# Patient Record
Sex: Male | Born: 1985 | Race: White | Hispanic: No | Marital: Single | Smoking: Current some day smoker
Health system: Southern US, Community
[De-identification: ages and names within clinical notes are randomized; demographics above are authoritative.]

## PROBLEM LIST (undated history)

## (undated) DIAGNOSIS — R011 Cardiac murmur, unspecified: Secondary | ICD-10-CM

## (undated) HISTORY — PX: MEDIAL COLLATERAL LIGAMENT REPAIR, KNEE: SHX2019

## (undated) HISTORY — PX: TONSILLECTOMY: SUR1361

---
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
## 2005-02-25 ENCOUNTER — Emergency Department (HOSPITAL_COMMUNITY): Admission: EM | Admit: 2005-02-25 | Discharge: 2005-02-26 | Payer: Self-pay | Admitting: Emergency Medicine

## 2005-03-13 ENCOUNTER — Ambulatory Visit (HOSPITAL_COMMUNITY): Admission: RE | Admit: 2005-03-13 | Discharge: 2005-03-13 | Payer: Self-pay | Admitting: Family Medicine

## 2005-06-18 IMAGING — CR DG ANKLE COMPLETE 3+V*R*
2 series · 2 of 2 positions shown · non-contrast
Comparison: none

CLINICAL DATA: Ankle injury. 
 RIGHT ANKLE THREE VIEWS ([DATE] HOURS)

[view not recorded (1 of 2)]
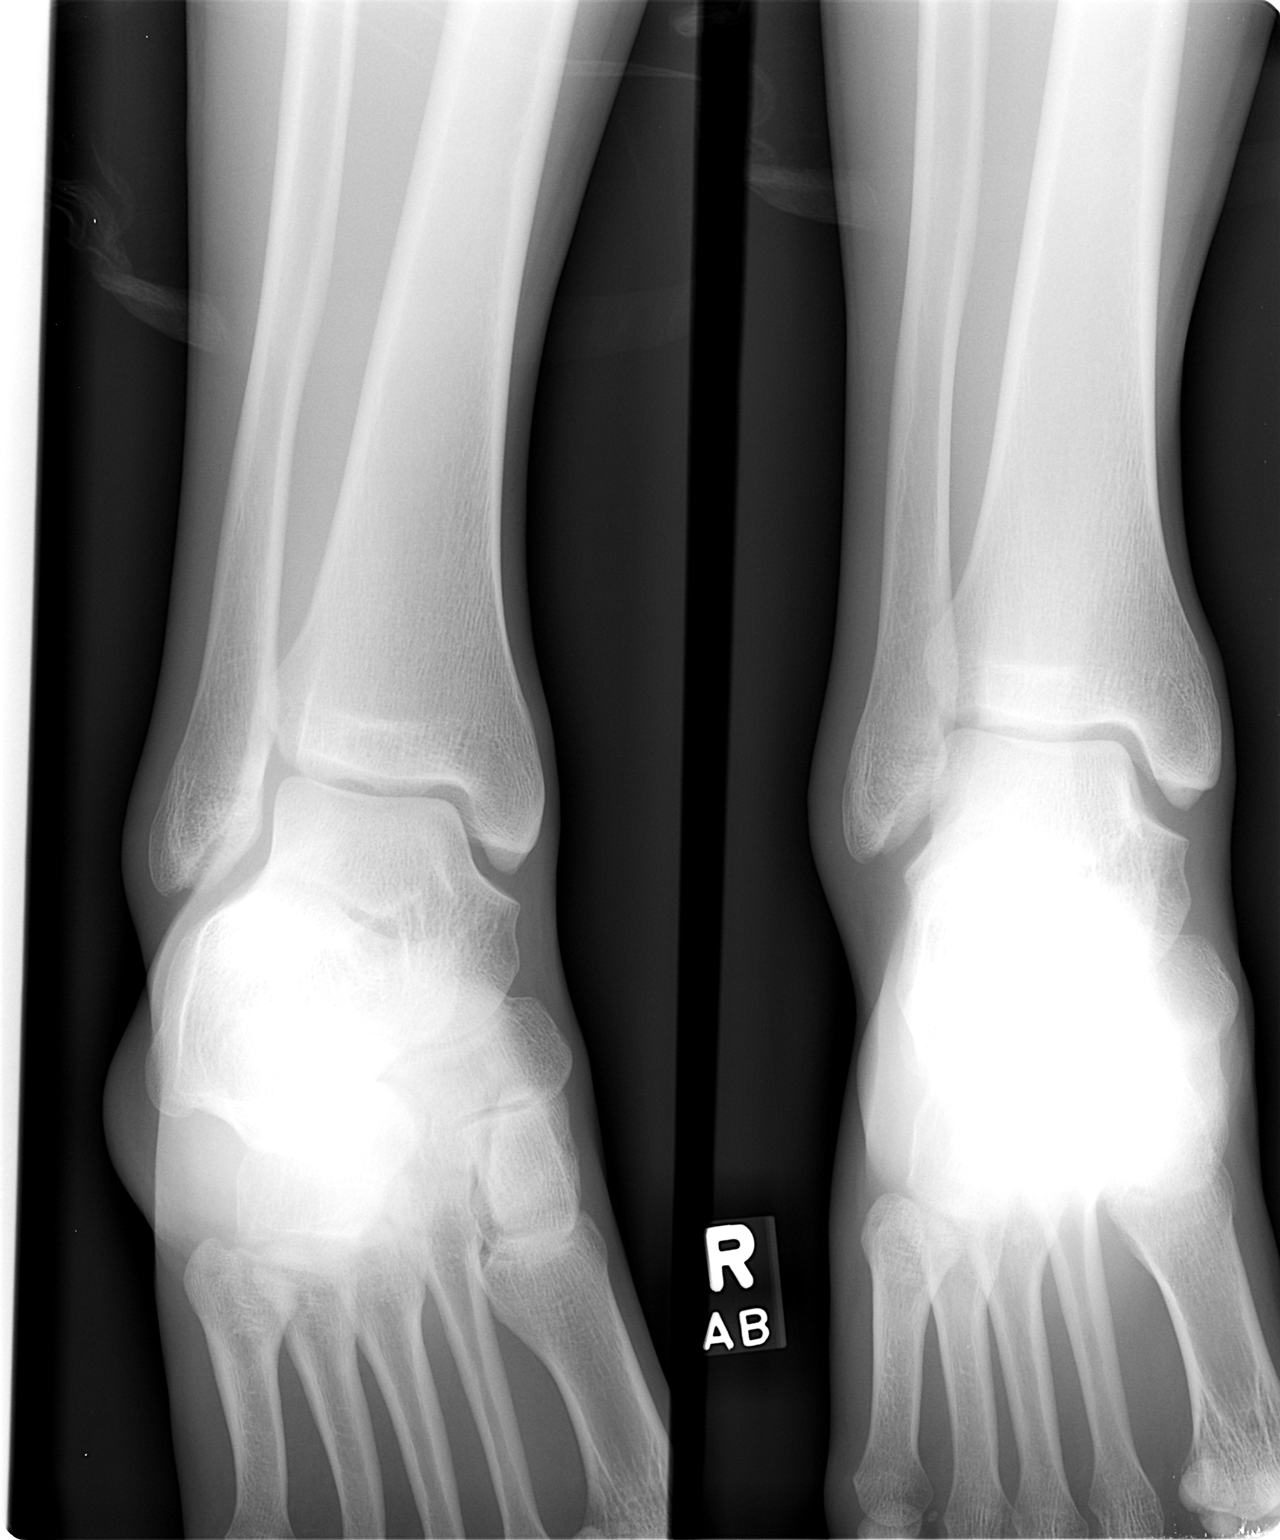

[view not recorded (2 of 2)]
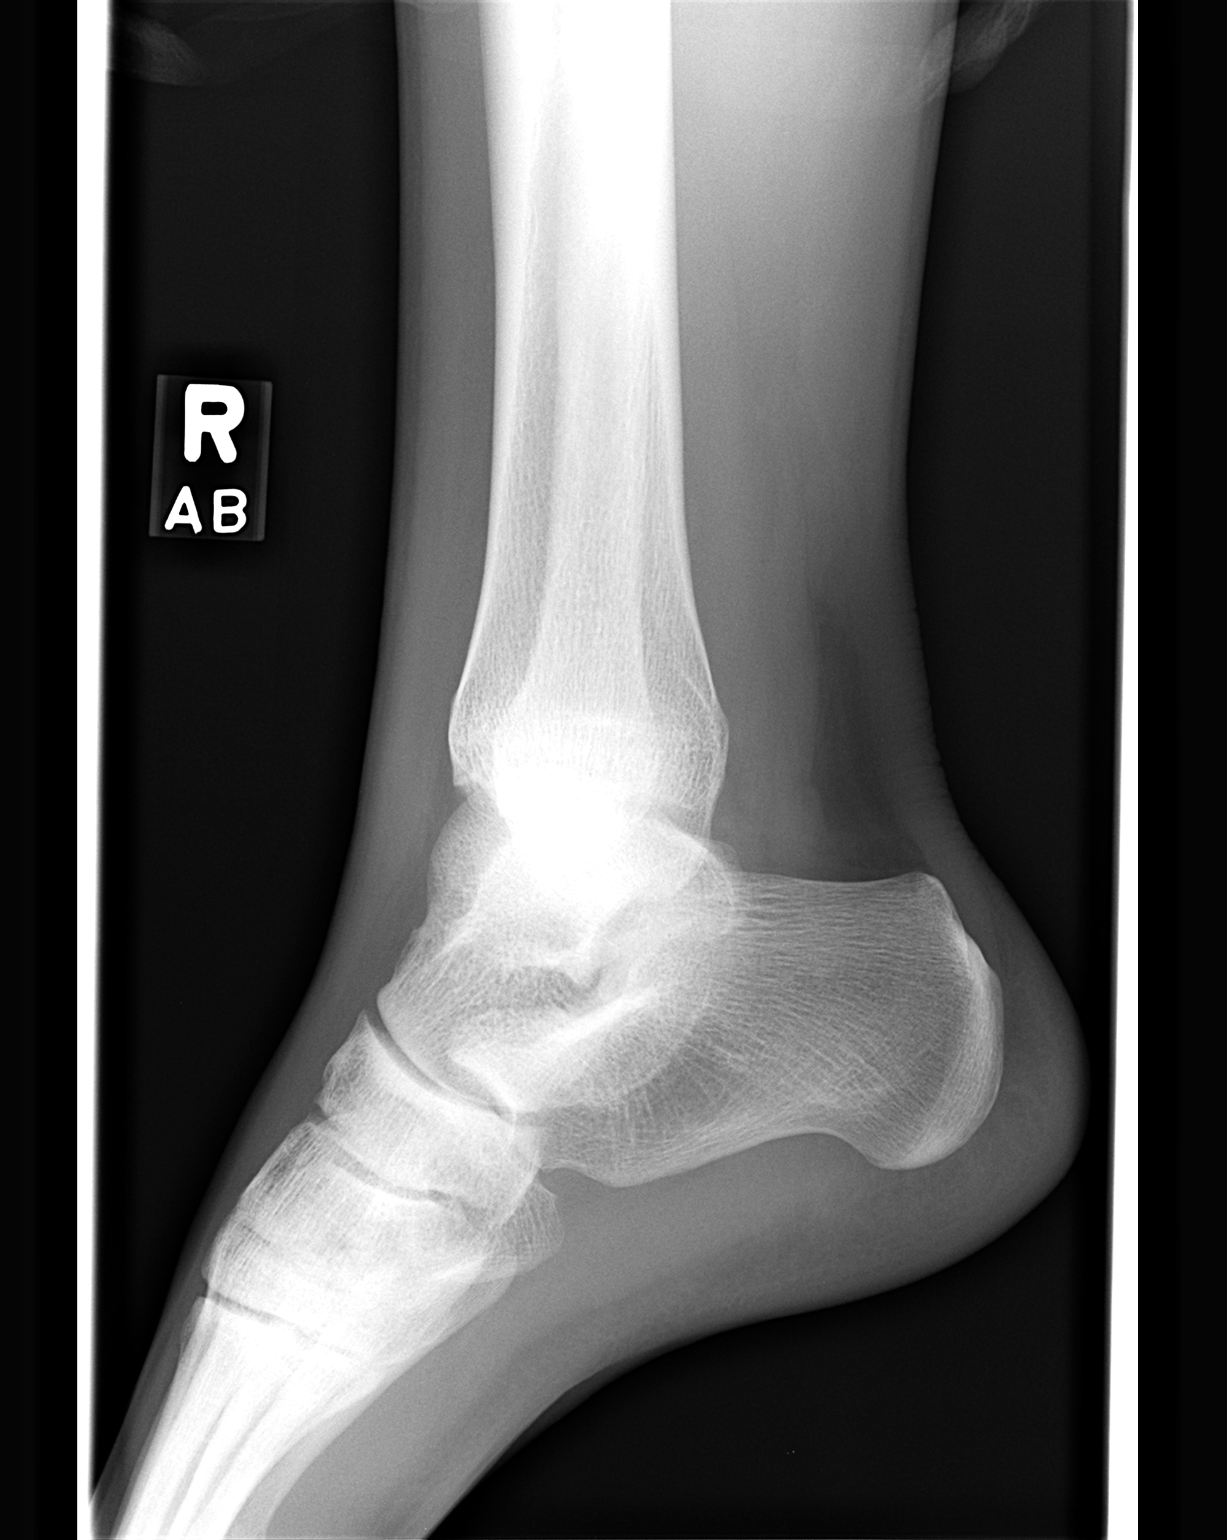

[2 of 2 positions shown; findings below may reference images not displayed]

FINDINGS: No fractures or dislocations are seen.  Soft tissue swelling is seen about the lateral malleolus. 
 IMPRESSION
 No acute fracture. 
 RIGHT FOOT (THREE VIEWS)

  There is no evidence of fracture or dislocation.  No other significant bone or soft tissue abnormalities are identified.  The joint spaces are within normal limits. 

 IMPRESSION
 Normal study.

## 2007-04-23 ENCOUNTER — Emergency Department (HOSPITAL_COMMUNITY): Admission: EM | Admit: 2007-04-23 | Discharge: 2007-04-23 | Payer: Self-pay | Admitting: Emergency Medicine

## 2007-12-14 ENCOUNTER — Emergency Department (HOSPITAL_COMMUNITY): Admission: EM | Admit: 2007-12-14 | Discharge: 2007-12-14 | Payer: Self-pay | Admitting: Emergency Medicine

## 2008-10-24 ENCOUNTER — Emergency Department (HOSPITAL_COMMUNITY): Admission: EM | Admit: 2008-10-24 | Discharge: 2008-10-24 | Payer: Self-pay | Admitting: Emergency Medicine

## 2009-03-24 IMAGING — CR DG CHEST 2V
2 series · 2 of 2 positions shown · non-contrast
Comparison: None

CLINICAL DATA: Chest pain, short of breath, smoking history

CHEST - 2 VIEW

[w chest pa]
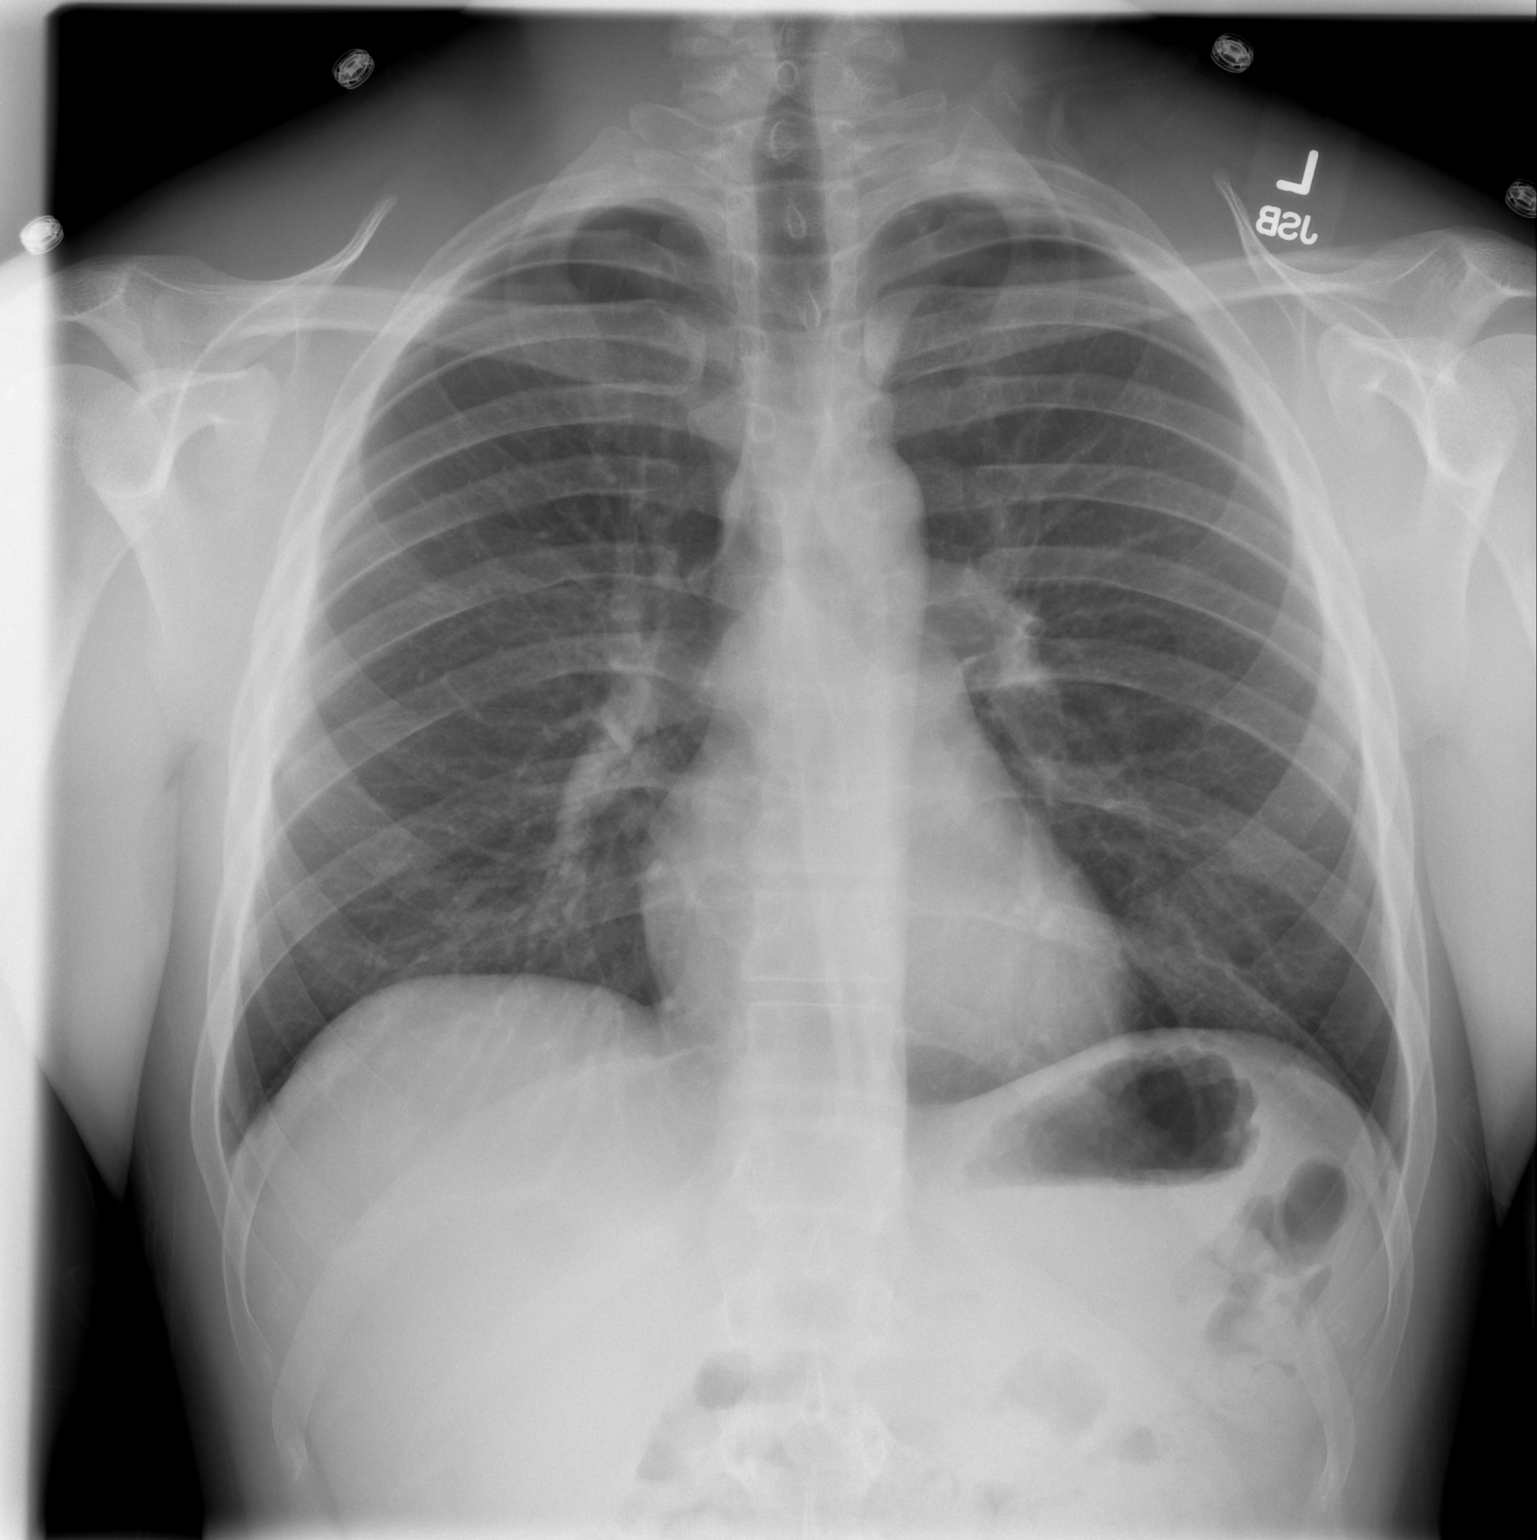

[w chest lat]
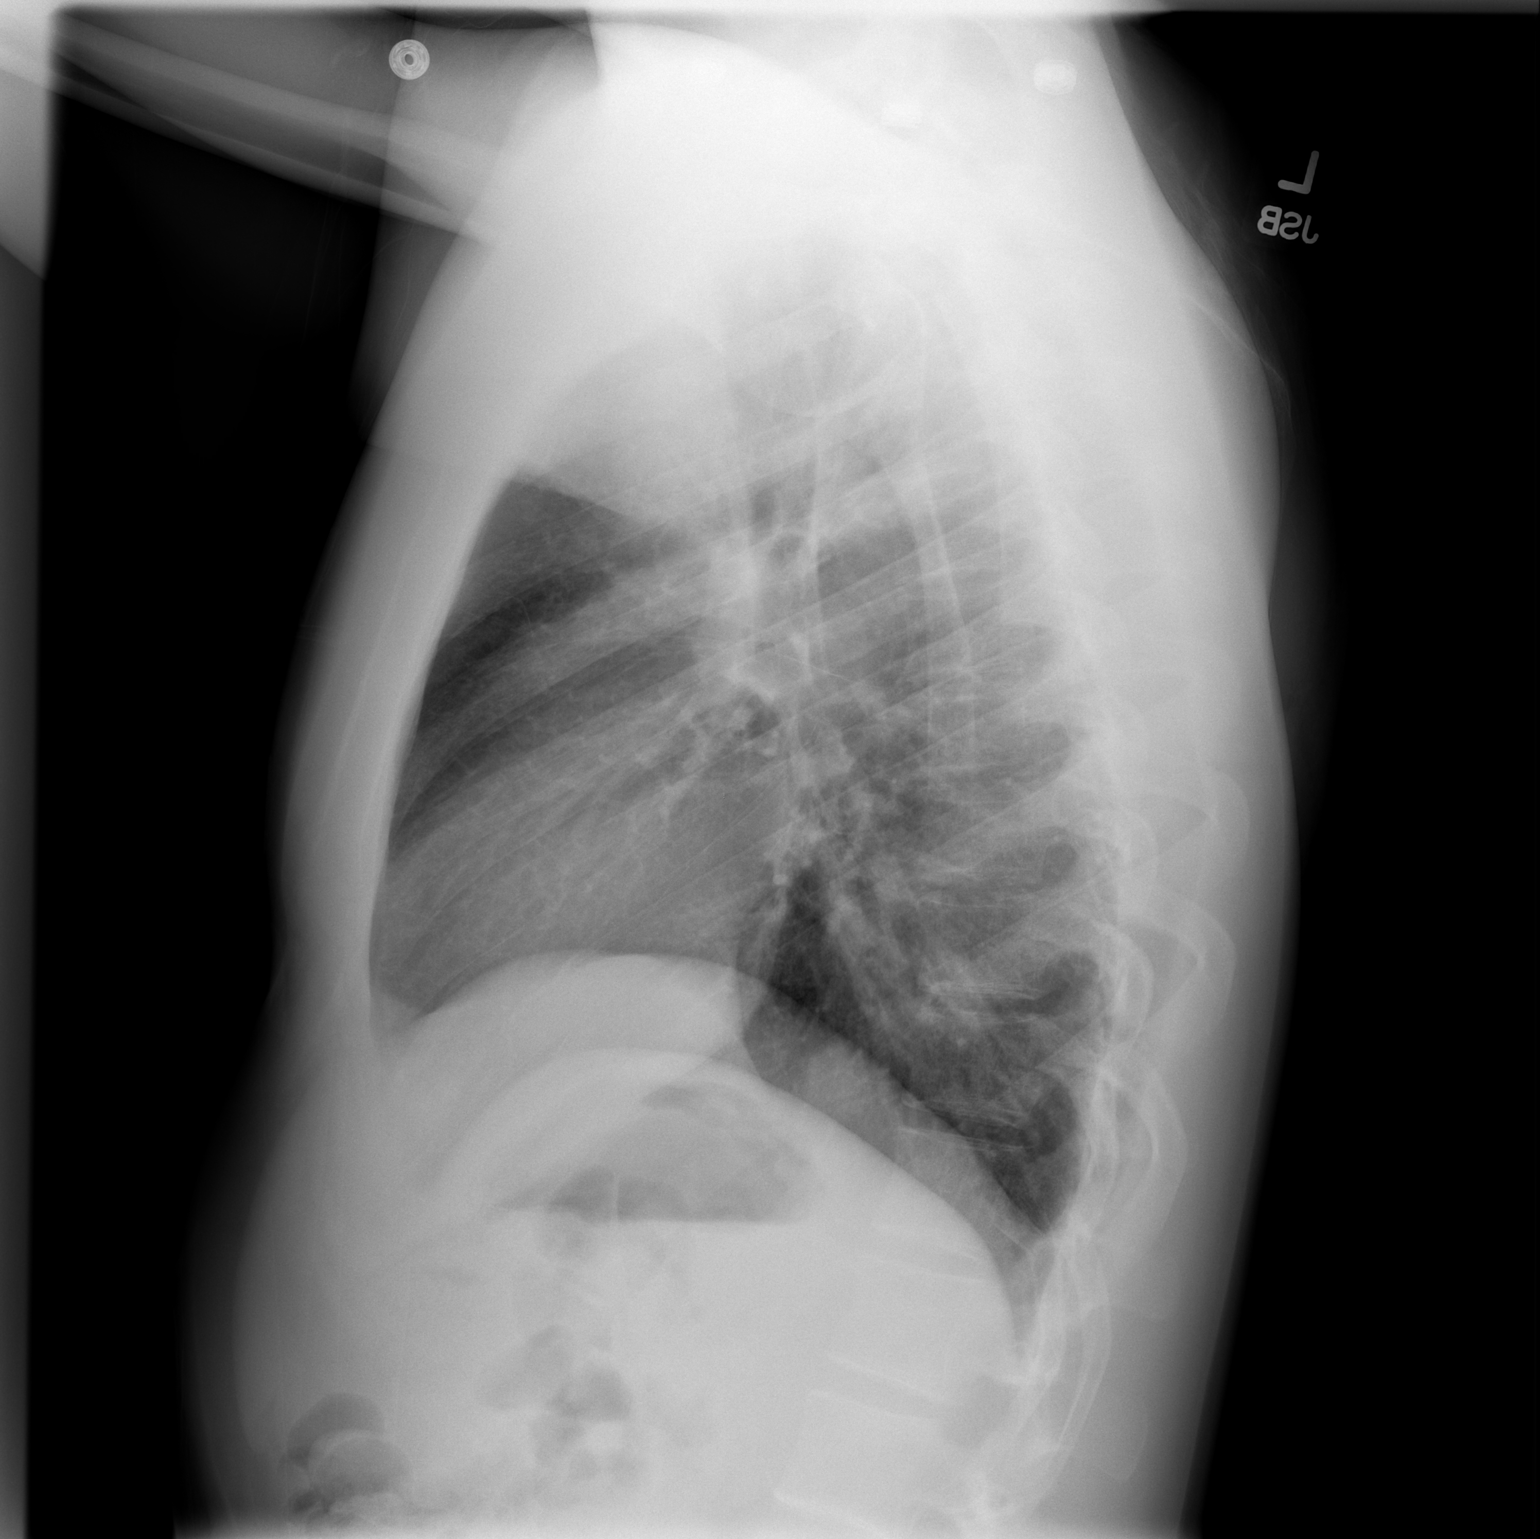

[2 of 2 positions shown; findings below may reference images not displayed]

FINDINGS: Two views of the chest show the lungs to be clear. Mild
peribronchial thickening is noted. The heart is within normal
limits in size.  No bony abnormality is seen.
IMPRESSION: .  No active lung disease.

## 2010-09-01 LAB — RAPID STREP SCREEN (MED CTR MEBANE ONLY): Streptococcus, Group A Screen (Direct): POSITIVE — AB

## 2011-09-23 ENCOUNTER — Encounter (HOSPITAL_COMMUNITY): Payer: Self-pay | Admitting: Family Medicine

## 2011-09-23 ENCOUNTER — Emergency Department (HOSPITAL_COMMUNITY)
Admission: EM | Admit: 2011-09-23 | Discharge: 2011-09-23 | Disposition: A | Discharge status: Home / Self Care | Payer: Worker's Compensation | Attending: Emergency Medicine | Admitting: Emergency Medicine

## 2011-09-23 DIAGNOSIS — W260XXA Contact with knife, initial encounter: Secondary | ICD-10-CM | POA: Insufficient documentation

## 2011-09-23 DIAGNOSIS — F17200 Nicotine dependence, unspecified, uncomplicated: Secondary | ICD-10-CM | POA: Insufficient documentation

## 2011-09-23 DIAGNOSIS — S61213A Laceration without foreign body of left middle finger without damage to nail, initial encounter: Secondary | ICD-10-CM

## 2011-09-23 DIAGNOSIS — S61209A Unspecified open wound of unspecified finger without damage to nail, initial encounter: Secondary | ICD-10-CM | POA: Insufficient documentation

## 2011-09-23 DIAGNOSIS — M79609 Pain in unspecified limb: Secondary | ICD-10-CM | POA: Insufficient documentation

## 2011-09-23 MED ORDER — TETANUS-DIPHTH-ACELL PERTUSSIS 5-2.5-18.5 LF-MCG/0.5 IM SUSP
0.5000 mL | Freq: Once | INTRAMUSCULAR | Status: AC
  Administered 2011-09-23: 0.5 mL via INTRAMUSCULAR
  Filled 2011-09-23: qty 0.5

## 2011-09-23 MED ORDER — NAPROXEN 500 MG PO TABS: 500.0000 mg | ORAL_TABLET | Freq: Two times a day (BID) | ORAL | Status: DC

## 2011-09-23 NOTE — Discharge Instructions (Signed)
The tissue glue will gradually come off over 7-10 days, do not peel off early, please avoid the use of her left hand for the next 2 days.

## 2011-09-23 NOTE — ED Provider Notes (Signed)
History     CSN: [id number]  Arrival date & time 09/23/11  8413   First MD Initiated Contact with Patient 09/23/11 0541      Chief Complaint  Patient presents with  . Extremity Laceration    (Consider location/radiation/quality/duration/timing/severity/associated sxs/prior treatment) HPI Comments: Acute onset of laceration to the dorsum of the left middle finger on the middle phalanx which occurred just prior to arrival. The pain is mild, persistent, worse with palpation and not associated with numbness or tingling of the finger.  The history is provided by the patient.    History reviewed. No pertinent past medical history.  Past Surgical History  Procedure Date  . Tonsillectomy     No family history on file.  History  Substance Use Topics  . Smoking status: Current Everyday Smoker -- 0.5 packs/day  . Smokeless tobacco: Not on file  . Alcohol Use: Yes     occasional      Review of Systems  Constitutional: Negative for fever.  Gastrointestinal: Negative for vomiting.  Skin: Positive for wound.       Laceration  Neurological: Negative for weakness and numbness.    Allergies  Ceclor  Home Medications   Current Outpatient Rx  Name Route Sig Dispense Refill  . NAPROXEN 500 MG PO TABS Oral Take 1 tablet (500 mg total) by mouth 2 (two) times daily with a meal. 30 tablet 0    BP 131/73  Pulse 67  Temp(Src) 97.6 F (36.4 C) (Oral)  Resp 20  Wt 172 lb 6 oz (78.189 kg)  SpO2 100%  Physical Exam  Constitutional: He appears well-developed and well-nourished. No distress.  HENT:  Head: Normocephalic.  Eyes: Conjunctivae are normal. No scleral icterus.  Cardiovascular: Normal rate and regular rhythm.   Pulmonary/Chest: Effort normal and breath sounds normal.  Musculoskeletal: Normal range of motion. He exhibits tenderness ( ttp over the laceration site). He exhibits no edema.  Neurological: He is alert. Coordination normal.       Sensation and motor intact    Skin: Skin is warm and dry. He is not diaphoretic.       Laceration located on the dorsum of the left middle finger The Laceration is linear shaped The depth is superficial The length is 1.5 cm    ED Course  Procedures (including critical care time)  Labs Reviewed - No data to display No results found.   1. Laceration of left middle finger w/o foreign body w/o damage to nail       MDM  Patient is well appearing, mild laceration which can be repaired with Dermabond. Irrigated with 1 L of normal saline and wound cleansed and explored with no signs of foreign body. See repair note  LACERATION REPAIR Performed by: Vida Roller Authorized by: Vida Roller Consent: Verbal consent obtained. Risks and benefits: risks, benefits and alternatives were discussed Consent given by: patient Patient identity confirmed: provided demographic data Prepped and Draped in normal sterile fashion Wound explored  Laceration Location: Dorsum of left middle finger  Laceration Length: 1.5 cm cm  No Foreign Bodies seen or palpated  Anesthesia: None   Irrigation method: syringe Amount of cleaning: standard  Skin closure: Dermabond    Patient tolerance: Patient tolerated the procedure well with no immediate complications.    Patient informed of indications for return, agrees with plan, Naprosyn for home     Vida Roller, MD 09/23/11 0600

## 2011-09-23 NOTE — ED Notes (Signed)
Patient states he cut his finger on a box cutter.

## 2012-04-07 ENCOUNTER — Emergency Department (HOSPITAL_COMMUNITY)
Admission: EM | Admit: 2012-04-07 | Discharge: 2012-04-07 | Disposition: A | Discharge status: Home / Self Care | Payer: 59 | Attending: Emergency Medicine | Admitting: Emergency Medicine

## 2012-04-07 ENCOUNTER — Encounter (HOSPITAL_COMMUNITY): Payer: Self-pay | Admitting: *Deleted

## 2012-04-07 DIAGNOSIS — Y939 Activity, unspecified: Secondary | ICD-10-CM | POA: Insufficient documentation

## 2012-04-07 DIAGNOSIS — S90569A Insect bite (nonvenomous), unspecified ankle, initial encounter: Secondary | ICD-10-CM | POA: Insufficient documentation

## 2012-04-07 DIAGNOSIS — Z8679 Personal history of other diseases of the circulatory system: Secondary | ICD-10-CM | POA: Insufficient documentation

## 2012-04-07 DIAGNOSIS — T63481A Toxic effect of venom of other arthropod, accidental (unintentional), initial encounter: Secondary | ICD-10-CM

## 2012-04-07 DIAGNOSIS — Y929 Unspecified place or not applicable: Secondary | ICD-10-CM | POA: Insufficient documentation

## 2012-04-07 DIAGNOSIS — W57XXXA Bitten or stung by nonvenomous insect and other nonvenomous arthropods, initial encounter: Secondary | ICD-10-CM

## 2012-04-07 DIAGNOSIS — F17200 Nicotine dependence, unspecified, uncomplicated: Secondary | ICD-10-CM | POA: Insufficient documentation

## 2012-04-07 HISTORY — DX: Cardiac murmur, unspecified: R01.1

## 2012-04-07 MED ORDER — OXYCODONE-ACETAMINOPHEN 5-325 MG PO TABS
1.0000 | ORAL_TABLET | Freq: Once | ORAL | Status: AC
  Administered 2012-04-07: 1 via ORAL
  Filled 2012-04-07: qty 1

## 2012-04-07 MED ORDER — FAMOTIDINE 20 MG PO TABS: 20.0000 mg | ORAL_TABLET | Freq: Two times a day (BID) | ORAL | Status: DC

## 2012-04-07 MED ORDER — HYDROCODONE-ACETAMINOPHEN 5-325 MG PO TABS
1.0000 | ORAL_TABLET | Freq: Once | ORAL | Status: DC
  Filled 2012-04-07: qty 1

## 2012-04-07 MED ORDER — OXYCODONE-ACETAMINOPHEN 5-325 MG PO TABS: 1.0000 | ORAL_TABLET | ORAL | Status: DC | PRN

## 2012-04-07 MED ORDER — DIPHENHYDRAMINE HCL 25 MG PO TABS: 25.0000 mg | ORAL_TABLET | Freq: Four times a day (QID) | ORAL | Status: DC

## 2012-04-07 NOTE — ED Notes (Signed)
Patient given discharge instructions, information, prescriptions, and diet order. Patient states that they adequately understand discharge information given and to return to ED if symptoms return or worsen.     

## 2012-04-07 NOTE — ED Provider Notes (Signed)
History     CSN: [id number]  Arrival date & time 04/07/12  2057   First MD Initiated Contact with Patient 04/07/12 2241      Chief Complaint  Patient presents with  . Insect Bite    (Consider location/radiation/quality/duration/timing/severity/associated sxs/prior treatment) HPI Comments: Patient reports he was working outside today when he felt something stinging and burning on his left calf under his pants leg.  States he hit the area, trying to kill whatever insect was biting him.  Pt never saw the type of insect that bit him.  He has since developed pain and swelling that extends distal to the bite mark into his ankle and foot.  Has throbbing pain in his foot and ankle and occasional itching his foot.  Pain is worse with attempting to bear weight.  Denies fevers, chills, myalgias, abdominal pain, N/V.    The history is provided by the patient.    Past Medical History  Diagnosis Date  . Heart murmur     Past Surgical History  Procedure Date  . Tonsillectomy     History reviewed. No pertinent family history.  History  Substance Use Topics  . Smoking status: Current Every Day Smoker -- 0.5 packs/day  . Smokeless tobacco: Not on file  . Alcohol Use: Yes     Comment: occasional      Review of Systems  Constitutional: Negative for fever and chills.  Respiratory: Negative for shortness of breath.   Cardiovascular: Negative for chest pain.  Gastrointestinal: Negative for nausea, vomiting and abdominal pain.  Musculoskeletal: Negative for myalgias.  Skin: Positive for color change and wound.  Neurological: Negative for weakness and numbness.    Allergies  Ceclor and Oxycodone  Home Medications  No current outpatient prescriptions on file.  BP 142/84  Pulse 95  Temp 97.6 F (36.4 C) (Oral)  Resp 16  Ht 5\' 11"  (1.803 m)  Wt 165 lb (74.844 kg)  BMI 23.01 kg/m2  SpO2 98%  Physical Exam  Nursing note and vitals reviewed. Constitutional: He appears  well-developed and well-nourished. No distress.  HENT:  Head: Normocephalic and atraumatic.  Neck: Neck supple.  Pulmonary/Chest: Effort normal.  Neurological: He is alert.  Skin: He is not diaphoretic.          Distal pulses intact, sensation intact.      ED Course  Procedures (including critical care time)  Labs Reviewed - No data to display No results found.   1. Insect bite of left leg   2. Local reaction to insect sting     MDM  Pt with insect bite to left calf, pt did not see the insect though he does have bite mark in the affected area.  There is a single lesion.  No possibility of snake bite.   No systemic symptoms, no allergic reaction.  No necrosis of the area.  Pt is having localized reaction with swelling, redness, and pain.  Pain is worse in the area where he is most swollen.  Mild itching of left foot.  Discussed diagnosis, treatment plan, and follow up with patient.  Advised close monitoring of the area and return for worsening symptoms.  Pt given return precautions.  Pt verbalizes understanding and agrees with plan.           [place], [place] 04/08/12 [phone]

## 2012-04-07 NOTE — ED Notes (Signed)
Patient alert and oriented- ambulatory in triage. Patient sts he was working in his yard earlier when he felt something bite him. Red induration noted to left lower leg. Slight redness following up leg, ankle is very swollen. Patient sts walking has become more difficult throughout the day. No throat swelling or respiratory difficulty.

## 2012-04-12 NOTE — ED Provider Notes (Signed)
Medical screening examination/treatment/procedure(s) were performed by non-physician practitioner and as supervising physician I was immediately available for consultation/collaboration.   Lyanne Co, MD 04/12/12 [phone]

## 2013-06-14 ENCOUNTER — Emergency Department (HOSPITAL_COMMUNITY)
Admission: EM | Admit: 2013-06-14 | Discharge: 2013-06-14 | Disposition: A | Discharge status: Home / Self Care | Payer: 59 | Attending: Emergency Medicine | Admitting: Emergency Medicine

## 2013-06-14 ENCOUNTER — Encounter (HOSPITAL_COMMUNITY): Payer: Self-pay | Admitting: Emergency Medicine

## 2013-06-14 DIAGNOSIS — R011 Cardiac murmur, unspecified: Secondary | ICD-10-CM | POA: Insufficient documentation

## 2013-06-14 DIAGNOSIS — L03211 Cellulitis of face: Principal | ICD-10-CM | POA: Insufficient documentation

## 2013-06-14 DIAGNOSIS — L0201 Cutaneous abscess of face: Secondary | ICD-10-CM | POA: Insufficient documentation

## 2013-06-14 DIAGNOSIS — F17200 Nicotine dependence, unspecified, uncomplicated: Secondary | ICD-10-CM | POA: Insufficient documentation

## 2013-06-14 MED ORDER — OXYCODONE-ACETAMINOPHEN 5-325 MG PO TABS
1.0000 | ORAL_TABLET | Freq: Once | ORAL | Status: AC
  Administered 2013-06-14: 1 via ORAL
  Filled 2013-06-14: qty 1

## 2013-06-14 MED ORDER — OXYCODONE-ACETAMINOPHEN 5-325 MG PO TABS: 1.0000 | ORAL_TABLET | ORAL | Status: AC | PRN

## 2013-06-14 MED ORDER — SULFAMETHOXAZOLE-TMP DS 800-160 MG PO TABS
1.0000 | ORAL_TABLET | Freq: Once | ORAL | Status: AC
  Administered 2013-06-14: 1 via ORAL
  Filled 2013-06-14: qty 1

## 2013-06-14 MED ORDER — SULFAMETHOXAZOLE-TRIMETHOPRIM 800-160 MG PO TABS: 1.0000 | ORAL_TABLET | Freq: Two times a day (BID) | ORAL | Status: AC

## 2013-06-14 NOTE — ED Notes (Signed)
Pt states that he has a "knot" above right eye x 2 weeks; pt states that the "knot" is progressively getting worse; pt states that there is pressure to rt eye and states "It feels like something is going to pop out of my head"' pt denies head injury; family states that he has had confusion and disorientation off and on for the last 2 weeks and can be unsteady on feet at times; pt states that he has taken numerous medications for sinus pain and headache with little to no relief

## 2013-06-14 NOTE — Discharge Instructions (Signed)
Heat Therapy Heat therapy can help ease achy, tense, stiff, and tight muscles and joints. Heat should not be used on new injuries. Wait at least 48 hours after the injury before using heat therapy. Heat also should not be used for discomfort or pain that occurs right after doing an activity. If you still have pain or stiffness 3 hours after finishing the activity, then heat therapy may be used. PRECAUTIONS  High heat or prolonged exposure to heat can cause burns. Be careful when using heat therapy to avoid burning your skin. If you have any of the following conditions, do not use heat until you have discussed heat therapy with your caregiver:  Poor circulation.  Healing wounds or scarred skin in the area being treated.  Diabetes, heart disease, or high blood pressure.  Numbness of the area being treated.  Unusual swelling of the area being treated.  Active infections.  Blood clots.  Cancer.  Inability to communicate your response to pain. This can include young children and people with dementia. HOME CARE INSTRUCTIONS Moist heat pack  Soak a clean towel in warm water, and squeeze out the extra water. The water temperature should be comfortable to the skin.  Put the warm, wet towel in a plastic bag.  Place a thin, dry towel between your skin and the bag.  Put the heat pack on the area for 5 minutes, and check your skin. Your skin may be pink, but it should not be red.  Leave the heat pack on the area for a total of 15 to 30 minutes.  Repeat this every 2 to 4 hours while awake. Do not use heat while you are sleeping. Warm water bath  Fill a tub with warm water. The water temperature should be comfortable to the skin.  Place the affected body part in the tub.  Soak the area for 20 to 40 minutes.  Repeat as needed. Hot water bottle  Fill the water bottle half full with hot water.  Press out the extra air. Close the cap tightly.  Place a dry towel between your skin and  the bottle.  Put the bottle on the area for 5 minutes, and check your skin. Your skin may be pink, but it should not be red.  Leave the bottle on the area for a total of 15 to 30 minutes.  Repeat this every 2 to 4 hours while awake. Electric heating pad  Place a dry towel between your skin and the heating pad.  Set the heating pad on low heat.  Put the heating pad on the area for 10 minutes, and check your skin. Your skin may be pink, but it should not be red.  Leave the heating pad on the area for a total of 20 to 40 minutes.  Repeat this every 2 to 4 hours while awake.  Do not lie on the heating pad.  Do not fall asleep while using the heating pad.  Do not use the heating pad near water. Contact with water can result in an electrical shock. SEEK MEDICAL CARE IF:  You have blisters, redness, swelling, or numbness.  You have any new problems.  Your problems are getting worse.  You have any questions or concerns. If you develop any problems, stop using heat therapy until you see your caregiver. MAKE SURE YOU:  Understand these instructions.  Will watch your condition.  Will get help right away if you are not doing well or get worse. Document Released: 08/03/2011  Document Reviewed: 08/03/2011 [facility]Exit[facility] Patient Information 2014 [place], [place]. Abscess An abscess is an infected area that contains a collection of pus and debris.It can occur in almost any part of the body. An abscess is also known as a furuncle or boil. CAUSES  An abscess occurs when tissue gets infected. This can occur from blockage of oil or sweat glands, infection of hair follicles, or a minor injury to the skin. As the body tries to fight the infection, pus collects in the area and creates pressure under the skin. This pressure causes pain. People with weakened immune systems have difficulty fighting infections and get certain abscesses more often.  SYMPTOMS Usually an abscess develops on the skin and  becomes a painful mass that is red, warm, and tender. If the abscess forms under the skin, you may feel a moveable soft area under the skin. Some abscesses break open (rupture) on their own, but most will continue to get worse without care. The infection can spread deeper into the body and eventually into the bloodstream, causing you to feel ill.  DIAGNOSIS  Your caregiver will take your medical history and perform a physical exam. A sample of fluid may also be taken from the abscess to determine what is causing your infection. TREATMENT  Your caregiver may prescribe antibiotic medicines to fight the infection. However, taking antibiotics alone usually does not cure an abscess. Your caregiver may need to make a small cut (incision) in the abscess to drain the pus. In some cases, gauze is packed into the abscess to reduce pain and to continue draining the area. HOME CARE INSTRUCTIONS   Only take over-the-counter or prescription medicines for pain, discomfort, or fever as directed by your caregiver.  If you were prescribed antibiotics, take them as directed. Finish them even if you start to feel better.  If gauze is used, follow your caregiver's directions for changing the gauze.  To avoid spreading the infection:  Keep your draining abscess covered with a bandage.  Wash your hands well.  Do not share personal care items, towels, or whirlpools with others.  Avoid skin contact with others.  Keep your skin and clothes clean around the abscess.  Keep all follow-up appointments as directed by your caregiver. SEEK MEDICAL CARE IF:   You have increased pain, swelling, redness, fluid drainage, or bleeding.  You have muscle aches, chills, or a general ill feeling.  You have a fever. MAKE SURE YOU:   Understand these instructions.  Will watch your condition.  Will get help right away if you are not doing well or get worse. Document Released: 02/18/2005 Document Revised: 11/10/2011  Document Reviewed: 07/24/2011 [facility]Exit[facility] Patient Information 2014 [place]ExitCare, [place]LLC.

## 2013-06-14 NOTE — ED Provider Notes (Signed)
CSN: [id number]     Arrival date & time 06/14/13  2018 History   First MD Initiated Contact with Patient 06/14/13 2050     Chief Complaint  Patient presents with  . Headache   (Consider location/radiation/quality/duration/timing/severity/associated sxs/prior Treatment) Patient is a 28 y.o. male presenting with eye pain. The history is provided by the patient and the spouse. No language interpreter was used.  Eye Pain Associated symptoms include headaches. Pertinent negatives include no congestion, fever, nausea or vomiting. Associated symptoms comments: He has moderate to severe pain over his right eye for the past 2 weeks. There is intermittent swelling and redness. No lesion or drainage. He has not had any fever, sinus pressure or nasal congestion. No redness, pain or drainage associated with the eye. Marland Kitchen.    Past Medical History  Diagnosis Date  . Heart murmur    Past Surgical History  Procedure Laterality Date  . Tonsillectomy    . Medial collateral ligament repair, knee     No family history on file. History  Substance Use Topics  . Smoking status: Current Some Day Smoker -- 0.50 packs/day  . Smokeless tobacco: Not on file  . Alcohol Use: Yes     Comment: occasional    Review of Systems  Constitutional: Negative for fever.  HENT: Negative for congestion and sinus pressure.   Eyes: Negative for discharge and visual disturbance.       See HPI.  Gastrointestinal: Negative for nausea and vomiting.  Skin: Positive for color change.  Neurological: Positive for headaches.    Allergies  Ceclor and Vicodin  Home Medications   Current Outpatient Rx  Name  Route  Sig  Dispense  Refill  . diphenhydrAMINE (BENADRYL) 25 MG tablet   Oral   Take 25 mg by mouth every 6 (six) hours as needed for itching or sleep.          BP 142/83  Pulse 74  Temp(Src) 97.7 F (36.5 C) (Oral)  Resp 20  Ht 5\' 10"  (1.778 m)  Wt 165 lb (74.844 kg)  BMI 23.68 kg/m2  SpO2 100% Physical Exam   Constitutional: He is oriented to person, place, and time. He appears well-developed and well-nourished. No distress.  HENT:  Right eye brow with mild redness medially. No visualized swelling or palpable induration. Significantly tender.   Cardiovascular: Normal rate.   No murmur heard. Pulmonary/Chest: Effort normal.  Neurological: He is alert and oriented to person, place, and time.  Psychiatric: He has a normal mood and affect.    ED Course  Procedures (including critical care time) Labs Review Labs Reviewed - No data to display Imaging Review No results found.  EKG Interpretation   None       MDM  No diagnosis found. 1. Early abscess  No pain with eye movement, non-focal neurologic exam. Pain is localized with subjective history intermittent swelling and significant pain. Will treat with abx - ?abscess - and encourage PCP follow up .    Arnoldo HookerShari A Aarin Bluett, PA-C 06/14/13 2212

## 2013-06-15 NOTE — ED Provider Notes (Signed)
Medical screening examination/treatment/procedure(s) were performed by non-physician practitioner and as supervising physician I was immediately available for consultation/collaboration.  EKG Interpretation   None        Liala Codispoti R. Galit Urich, MD 06/15/13 0013 

## 2013-10-18 ENCOUNTER — Encounter (HOSPITAL_BASED_OUTPATIENT_CLINIC_OR_DEPARTMENT_OTHER): Payer: Self-pay | Admitting: Emergency Medicine

## 2013-10-18 ENCOUNTER — Emergency Department (HOSPITAL_BASED_OUTPATIENT_CLINIC_OR_DEPARTMENT_OTHER)
Admission: EM | Admit: 2013-10-18 | Discharge: 2013-10-18 | Disposition: A | Discharge status: Home / Self Care | Payer: 59 | Attending: Emergency Medicine | Admitting: Emergency Medicine

## 2013-10-18 DIAGNOSIS — F17200 Nicotine dependence, unspecified, uncomplicated: Secondary | ICD-10-CM | POA: Insufficient documentation

## 2013-10-18 DIAGNOSIS — R011 Cardiac murmur, unspecified: Secondary | ICD-10-CM | POA: Insufficient documentation

## 2013-10-18 DIAGNOSIS — M7710 Lateral epicondylitis, unspecified elbow: Secondary | ICD-10-CM | POA: Insufficient documentation

## 2013-10-18 DIAGNOSIS — Z9089 Acquired absence of other organs: Secondary | ICD-10-CM | POA: Insufficient documentation

## 2013-10-18 DIAGNOSIS — J029 Acute pharyngitis, unspecified: Secondary | ICD-10-CM | POA: Insufficient documentation

## 2013-10-18 LAB — RAPID STREP SCREEN (MED CTR MEBANE ONLY): Streptococcus, Group A Screen (Direct): NEGATIVE

## 2013-10-18 MED ORDER — IBUPROFEN 400 MG PO TABS: 600.0000 mg | ORAL_TABLET | Freq: Once | ORAL | Status: DC

## 2013-10-18 NOTE — ED Notes (Signed)
Ibuprofen not given.

## 2013-10-18 NOTE — Discharge Instructions (Signed)
Pharyngitis Pharyngitis is redness, pain, and swelling (inflammation) of your pharynx.  CAUSES  Pharyngitis is usually caused by infection. Most of the time, these infections are from viruses (viral) and are part of a cold. However, sometimes pharyngitis is caused by bacteria (bacterial). Pharyngitis can also be caused by allergies. Viral pharyngitis may be spread from person to person by coughing, sneezing, and personal items or utensils (cups, forks, spoons, toothbrushes). Bacterial pharyngitis may be spread from person to person by more intimate contact, such as kissing.  SIGNS AND SYMPTOMS  Symptoms of pharyngitis include:   Sore throat.   Tiredness (fatigue).   Low-grade fever.   Headache.  Joint pain and muscle aches.  Skin rashes.  Swollen lymph nodes.  Plaque-like film on throat or tonsils (often seen with bacterial pharyngitis). DIAGNOSIS  Your health care provider will ask you questions about your illness and your symptoms. Your medical history, along with a physical exam, is often all that is needed to diagnose pharyngitis. Sometimes, a rapid strep test is done. Other lab tests may also be done, depending on the suspected cause.  TREATMENT  Viral pharyngitis will usually get better in 3 4 days without the use of medicine. Bacterial pharyngitis is treated with medicines that kill germs (antibiotics).  HOME CARE INSTRUCTIONS   Drink enough water and fluids to keep your urine clear or pale yellow.   Only take over-the-counter or prescription medicines as directed by your health care provider:   If you are prescribed antibiotics, make sure you finish them even if you start to feel better.   Do not take aspirin.   Get lots of rest.   Gargle with 8 oz of salt water ( tsp of salt per 1 qt of water) as often as every 1 2 hours to soothe your throat.   Throat lozenges (if you are not at risk for choking) or sprays may be used to soothe your throat. SEEK MEDICAL  CARE IF:   You have large, tender lumps in your neck.  You have a rash.  You cough up green, yellow-brown, or bloody spit. SEEK IMMEDIATE MEDICAL CARE IF:   Your neck becomes stiff.  You drool or are unable to swallow liquids.  You vomit or are unable to keep medicines or liquids down.  You have severe pain that does not go away with the use of recommended medicines.  You have trouble breathing (not caused by a stuffy nose). MAKE SURE YOU:   Understand these instructions.  Will watch your condition.  Will get help right away if you are not doing well or get worse. Document Released: 05/11/2005 Document Revised: 03/01/2013 Document Reviewed: 01/16/2013 [facility]Exit[facility] Patient Information 2014 [place]ExitCare, [place]LL[place].   Tennis Elbow Your caregiver has diagnosed you with a condition often referred to as "tennis elbow." This results from small tears or soreness (inflammation) at the start (origin) of the extensor muscles of the forearm. Although the condition is often called tennis or golfer's elbow, it is caused by any repetitive action performed by your elbow. HOME CARE INSTRUCTIONS  If the condition has been short lived, rest may be the only treatment required. Using your opposite hand or arm to perform the task may help. Even changing your grip may help rest the extremity. These may even prevent the condition from recurring.  Longer standing problems, however, will often be relieved faster by:  Using anti-inflammatory agents.  Applying ice packs for 30 minutes at the end of the working day, at bed time,  or when activities are finished.  Your caregiver may also have you wear a splint or sling. This will allow the inflamed tendon to heal. At times, steroid injections aided with a local anesthetic will be required along with splinting for 1 to 2 weeks. Two to three steroid injections will often solve the problem. In some long standing cases, the inflamed tendon does not respond to  conservative (non-surgical) therapy. Then surgery may be required to repair it. MAKE SURE YOU:   Understand these instructions.  Will watch your condition.  Will get help right away if you are not doing well or get worse. Document Released: 05/11/2005 Document Revised: 08/03/2011 Document Reviewed: 12/28/2007 [facility] Patient Information 2014 [place], [place].

## 2013-10-18 NOTE — ED Provider Notes (Signed)
CSN: [id number]     Arrival date & time 10/18/13  1051 History   First MD Initiated Contact with Patient 10/18/13 1131     Chief Complaint  Patient presents with  . cough sore throat   . check muscle in right arm above elbow      (Consider location/radiation/quality/duration/timing/severity/associated sxs/prior Treatment) Patient is a 28 y.o. male presenting with pharyngitis.  Sore Throat This is a new problem. The current episode started yesterday. The problem occurs constantly. The problem has been gradually worsening. Pertinent negatives include no chest pain, no abdominal pain and no shortness of breath. Associated symptoms comments: Mild cough, congestion, no fevers. The symptoms are aggravated by swallowing. He has tried nothing for the symptoms.    Past Medical History  Diagnosis Date  . Heart murmur    Past Surgical History  Procedure Laterality Date  . Tonsillectomy    . Medial collateral ligament repair, knee     History reviewed. No pertinent family history. History  Substance Use Topics  . Smoking status: Current Some Day Smoker -- 0.50 packs/day  . Smokeless tobacco: Not on file  . Alcohol Use: Yes     Comment: occasional    Review of Systems  Respiratory: Negative for shortness of breath.   Cardiovascular: Negative for chest pain.  Gastrointestinal: Negative for abdominal pain.  All other systems reviewed and are negative.     Allergies  Ceclor and Vicodin  Home Medications   Prior to Admission medications   Medication Sig Start Date End Date Taking? Authorizing Provider  diphenhydrAMINE (BENADRYL) 25 MG tablet Take 25 mg by mouth every 6 (six) hours as needed for itching or sleep.    Historical Provider, MD  oxyCODONE-acetaminophen (PERCOCET/ROXICET) 5-325 MG per tablet Take 1 tablet by mouth every 4 (four) hours as needed for severe pain. 06/14/13   Shari A Upstill, PA-C  sulfamethoxazole-trimethoprim (SEPTRA DS) 800-160 MG per tablet Take 1 tablet  by mouth every 12 (twelve) hours. 06/14/13   Shari A Upstill, PA-C   BP 128/94  Pulse 93  Temp(Src) 98.3 F (36.8 C) (Oral)  Resp 18  Ht 5\' 10"  (1.778 m)  Wt 168 lb (76.204 kg)  BMI 24.11 kg/m2  SpO2 100% Physical Exam  Nursing note and vitals reviewed. Constitutional: He is oriented to person, place, and time. He appears well-developed and well-nourished. No distress.  HENT:  Head: Normocephalic and atraumatic.  Right Ear: Tympanic membrane normal.  Left Ear: Tympanic membrane normal.  Mouth/Throat: Oropharynx is clear and moist and mucous membranes are normal. No posterior oropharyngeal edema, posterior oropharyngeal erythema or tonsillar abscesses.  S/p tonsillectomy  Eyes: Conjunctivae are normal. Pupils are equal, round, and reactive to light. No scleral icterus.  Neck: Neck supple.  Cardiovascular: Normal rate, regular rhythm, normal heart sounds and intact distal pulses.   No murmur heard. Pulmonary/Chest: Effort normal and breath sounds normal. No stridor. No respiratory distress. He has no wheezes. He has no rales.  Abdominal: Soft. He exhibits no distension. There is no tenderness.  Musculoskeletal: Normal range of motion. He exhibits no edema.       Right elbow: He exhibits normal range of motion (pain with supination), no swelling, no effusion, no deformity and no laceration. Tenderness found. Lateral epicondyle tenderness noted.  Neurological: He is alert and oriented to person, place, and time.  Skin: Skin is warm and dry. No rash noted.  Psychiatric: He has a normal mood and affect. His behavior is normal.  ED Course  Procedures (including critical care time) Labs Review Labs Reviewed  RAPID STREP SCREEN  CULTURE, GROUP A STREP    Imaging Review No results found.   EKG Interpretation None      MDM   Final diagnoses:  Pharyngitis  Lateral epicondylitis    28 yo male with sore throat for 2 days.  Sick contacts with similar symptoms.  No trismus or  stridor.  No PTA.  Advised supportive treatment.   He also complains of several months of lateral elbow pain.  Repetitive motions at work.  Exam consistent with lateral epicondylitis.  No signs of infection.  Advised NSAIDs.      Candyce ChurnJohn David Jartavious Mckimmy III, MD 10/18/13 1247

## 2013-10-18 NOTE — ED Notes (Signed)
Started Tuesday having sore throat ear aches and cough states his children have strept throat diagnosed on Monday Also having "muscle type" pain in right arm above elbow. No new injury

## 2013-10-20 LAB — CULTURE, GROUP A STREP

## 2015-01-24 DEATH — deceased

## 2020-01-24 DEATH — deceased
